# Patient Record
Sex: Male | Born: 1990
Health system: Southern US, Community
[De-identification: ages and names within clinical notes are randomized; demographics above are authoritative.]

---
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
## 2012-04-16 ENCOUNTER — Encounter (HOSPITAL_COMMUNITY): Payer: Self-pay | Admitting: Emergency Medicine

## 2012-04-16 ENCOUNTER — Emergency Department (HOSPITAL_COMMUNITY)
Admission: EM | Admit: 2012-04-16 | Discharge: 2012-04-17 | Disposition: A | Discharge status: Home / Self Care | Payer: Self-pay | Attending: Emergency Medicine | Admitting: Emergency Medicine

## 2012-04-16 DIAGNOSIS — F17200 Nicotine dependence, unspecified, uncomplicated: Secondary | ICD-10-CM | POA: Insufficient documentation

## 2012-04-16 DIAGNOSIS — J40 Bronchitis, not specified as acute or chronic: Secondary | ICD-10-CM | POA: Insufficient documentation

## 2012-04-16 NOTE — ED Notes (Signed)
Pt alert, arrives from home, c/o cough URI s/s, onset was several weeks ago, resp even unlabored, skin pwd

## 2012-04-17 ENCOUNTER — Emergency Department (HOSPITAL_COMMUNITY): Payer: Self-pay

## 2012-04-17 MED ORDER — ALBUTEROL SULFATE HFA 108 (90 BASE) MCG/ACT IN AERS
2.0000 | INHALATION_SPRAY | RESPIRATORY_TRACT | Status: DC | PRN
  Administered 2012-04-17: 2 via RESPIRATORY_TRACT
  Filled 2012-04-17: qty 6.7

## 2012-04-17 MED ORDER — AZITHROMYCIN 250 MG PO TABS: 250.0000 mg | ORAL_TABLET | Freq: Every day | ORAL | Status: AC

## 2012-04-17 MED ORDER — HYDROCOD POLST-CHLORPHEN POLST 10-8 MG/5ML PO LQCR: ORAL | Status: AC

## 2012-04-17 NOTE — ED Notes (Signed)
[facility]--- was given Doxycycline 100mg  and completed the 7-day course but symptoms do not improve; pt reports that antibiotic did not do anything.

## 2012-04-17 NOTE — ED Provider Notes (Signed)
History     CSN: [id number]  Arrival date & time 04/16/12  2247   First MD Initiated Contact with Patient 04/17/12 0203      Chief Complaint  Patient presents with  . Cough    (Consider location/radiation/quality/duration/timing/severity/associated sxs/prior treatment) HPI This is a 21 year old white male with history take 3 month history of cough. The cough has become more productive the past 3 days. He states his sputum is of varying colors. He is having some nasal congestion and throat discomfort but denies earache, fever or diarrhea. He has had some posttussive emesis. His symptoms are described as moderate. He was recently treated with doxycycline without improvement. He has not been wheezing.  History reviewed. No pertinent past medical history.  History reviewed. No pertinent past surgical history.  No family history on file.  History  Substance Use Topics  . Smoking status: Current Everyday Smoker -- 1.0 packs/day    Types: Cigarettes  . Smokeless tobacco: Not on file  . Alcohol Use: No      Review of Systems  All other systems reviewed and are negative.    Allergies  Cefzil  Home Medications  No current outpatient prescriptions on file.  BP 127/70  Pulse 73  Temp 97.9 F (36.6 C) (Oral)  Resp 15  SpO2 100%  Physical Exam General: Well-developed, well-nourished male in no acute distress; appearance consistent with age of record HENT: normocephalic, atraumatic; nasal congestion; no pharyngeal erythema or exudate Eyes: pupils equal round and reactive to light; extraocular muscles intact Neck: supple Heart: regular rate and rhythm Lungs: clear to auscultation bilaterally; persistent dry cough Abdomen: soft; nondistended Extremities: No deformity; full range of motion Neurologic: Awake, alert and oriented; motor function intact in all extremities and symmetric; no facial droop Skin: Warm and dry Psychiatric: Normal mood and affect    ED Course    Procedures (including critical care time)     MDM  Nursing notes and vitals signs, including pulse oximetry, reviewed.  Summary of this visit's results, reviewed by myself:  Imaging Studies: Dg Chest 2 View  04/17/2012  *RADIOLOGY REPORT*  Clinical Data: Cough, chest pain  CHEST - 2 VIEW  Comparison: None.  Findings: Lungs are clear.  No pleural effusion or pneumothorax.  Cardiomediastinal silhouette is within normal limits.  Visualized osseous structures are within normal limits.  IMPRESSION: Normal chest radiographs.  Original Report Authenticated By: Charline Bills, M.D.            Hanley Seamen, MD 04/17/12 [phone]

## 2018-06-10 ENCOUNTER — Encounter (HOSPITAL_COMMUNITY): Payer: Self-pay | Admitting: Emergency Medicine

## 2018-06-10 ENCOUNTER — Emergency Department (HOSPITAL_COMMUNITY): Payer: No Typology Code available for payment source

## 2018-06-10 ENCOUNTER — Other Ambulatory Visit: Payer: Self-pay

## 2018-06-10 ENCOUNTER — Emergency Department (HOSPITAL_COMMUNITY)
Admission: EM | Admit: 2018-06-10 | Discharge: 2018-06-11 | Disposition: A | Discharge status: Home / Self Care | Payer: No Typology Code available for payment source | Attending: Emergency Medicine | Admitting: Emergency Medicine

## 2018-06-10 DIAGNOSIS — W260XXA Contact with knife, initial encounter: Secondary | ICD-10-CM | POA: Insufficient documentation

## 2018-06-10 DIAGNOSIS — Y990 Civilian activity done for income or pay: Secondary | ICD-10-CM | POA: Insufficient documentation

## 2018-06-10 DIAGNOSIS — S61215A Laceration without foreign body of left ring finger without damage to nail, initial encounter: Secondary | ICD-10-CM | POA: Insufficient documentation

## 2018-06-10 DIAGNOSIS — F17210 Nicotine dependence, cigarettes, uncomplicated: Secondary | ICD-10-CM | POA: Insufficient documentation

## 2018-06-10 DIAGNOSIS — S6992XA Unspecified injury of left wrist, hand and finger(s), initial encounter: Secondary | ICD-10-CM | POA: Diagnosis present

## 2018-06-10 DIAGNOSIS — Z79899 Other long term (current) drug therapy: Secondary | ICD-10-CM | POA: Diagnosis not present

## 2018-06-10 DIAGNOSIS — Y929 Unspecified place or not applicable: Secondary | ICD-10-CM | POA: Diagnosis not present

## 2018-06-10 DIAGNOSIS — S61311A Laceration without foreign body of left index finger with damage to nail, initial encounter: Secondary | ICD-10-CM

## 2018-06-10 DIAGNOSIS — Y939 Activity, unspecified: Secondary | ICD-10-CM | POA: Insufficient documentation

## 2018-06-10 MED ORDER — TETANUS-DIPHTH-ACELL PERTUSSIS 5-2.5-18.5 LF-MCG/0.5 IM SUSP
0.5000 mL | Freq: Once | INTRAMUSCULAR | Status: AC
Start: 2018-06-10 — End: 2018-06-10
  Administered 2018-06-10: 0.5 mL via INTRAMUSCULAR
  Filled 2018-06-10: qty 0.5

## 2018-06-10 MED ORDER — LIDOCAINE-EPINEPHRINE (PF) 2 %-1:200000 IJ SOLN
10.0000 mL | Freq: Once | INTRAMUSCULAR | Status: AC
  Administered 2018-06-10: 10 mL
  Filled 2018-06-10: qty 20

## 2018-06-10 NOTE — ED Provider Notes (Signed)
[facility] EMERGENCY DEPARTMENT Provider Note   CSN: [id number] Arrival date & time: 06/10/18  2155     History   Chief Complaint Chief Complaint  Patient presents with  . Laceration    HPI ANUSH WIEDEMAN is a 27 y.o. male.  27 year old male presents with laceration to the left index finger.  Patient states that he was using a knife at work to cut open a package of fish when he accidentally cut the tip of his left finger.  Last tetanus is unknown, bleeding is controlled prior to arrival.  No other complaints, injuries, concerns.     History reviewed. No pertinent past medical history.  There are no active problems to display for this patient.   History reviewed. No pertinent surgical history.      Home Medications    Prior to Admission medications   Medication Sig Start Date End Date Taking? Authorizing Provider  chlorpheniramine-HYDROcodone Stevphen Meuse PENNKINETIC ER) 10-8 MG/5ML LQCR Take 5 mL every 12 hours as needed for cough 04/17/12   Molpus, John, MD    Family History No family history on file.  Social History Social History   Tobacco Use  . Smoking status: Current Every Day Smoker    Packs/day: 1.00    Types: Cigarettes  . Smokeless tobacco: Never Used  Substance Use Topics  . Alcohol use: No  . Drug use: Never     Allergies   Cefzil [cefprozil]   Review of Systems Review of Systems  Constitutional: Negative for fever.  Musculoskeletal: Negative for arthralgias and myalgias.  Skin: Positive for wound.  Allergic/Immunologic: Negative for immunocompromised state.  Neurological: Negative for weakness and numbness.  Hematological: Does not bruise/bleed easily.  All other systems reviewed and are negative.    Physical Exam Updated Vital Signs BP 129/83 (BP Location: Left Arm)   Pulse 76   Temp 99.2 F (37.3 C) (Oral)   Resp 16   SpO2 100%   Physical Exam  Constitutional: He is oriented to person, place, and  time. He appears well-developed and well-nourished. No distress.  HENT:  Head: Normocephalic and atraumatic.  Cardiovascular: Intact distal pulses.  Pulmonary/Chest: Effort normal.  Musculoskeletal: He exhibits no tenderness or deformity.       Hands: Neurological: He is alert and oriented to person, place, and time. No sensory deficit.  Skin: Skin is warm and dry. He is not diaphoretic.  Psychiatric: He has a normal mood and affect. His behavior is normal.  Nursing note and vitals reviewed.    ED Treatments / Results  Labs (all labs ordered are listed, but only abnormal results are displayed) Labs Reviewed - No data to display  EKG None  Radiology Dg Hand Complete Right  Result Date: 06/10/2018 CLINICAL DATA:  Right index finger laceration EXAM: RIGHT HAND - COMPLETE 3+ VIEW COMPARISON:  None. FINDINGS: Soft tissue avulsion of the distal right index finger. No osseous injury. No retained foreign body. IMPRESSION: Avulsion type laceration of the distal soft tissues of the right index finger. No acute osseous injury or radiopaque foreign body. Electronically Signed   By: Deatra Robinson M.D.   On: 06/10/2018 23:07    Procedures .Marland KitchenLaceration Repair Date/Time: 06/11/2018 12:07 AM Performed by: Jeannie Fend, PA-C Authorized by: Jeannie Fend, PA-C   Consent:    Consent obtained:  Verbal   Consent given by:  Patient   Risks discussed:  Infection, need for additional repair, pain, poor cosmetic result and poor wound healing  Alternatives discussed:  No treatment Anesthesia (see MAR for exact dosages):    Anesthesia method:  Topical application   Topical anesthesia: lidocaine with epi soak. Laceration details:    Location:  Finger   Finger location:  L index finger   Length (cm):  0.7   Depth (mm):  3 Repair type:    Repair type:  Simple Pre-procedure details:    Preparation:  Patient was prepped and draped in usual sterile fashion and imaging obtained to evaluate for  foreign bodies Exploration:    Hemostasis achieved with:  Epinephrine   Wound exploration: wound explored through full range of motion and entire depth of wound probed and visualized     Wound extent: no foreign bodies/material noted and no muscle damage noted     Contaminated: no   Treatment:    Area cleansed with:  Saline   Amount of cleaning:  Standard   Irrigation solution:  Sterile saline Skin repair:    Repair method:  Tissue adhesive Approximation:    Approximation:  Close Post-procedure details:    Dressing:  Bulky dressing   Patient tolerance of procedure:  Tolerated well, no immediate complications   (including critical care time)  Medications Ordered in ED Medications  lidocaine-EPINEPHrine (XYLOCAINE W/EPI) 2 %-1:200000 (PF) injection 10 mL (10 mLs Other Given 06/10/18 2344)  Tdap (BOOSTRIX) injection 0.5 mL (0.5 mLs Intramuscular Given 06/10/18 2344)     Initial Impression / Assessment and Plan / ED Course  I have reviewed the triage vital signs and the nursing notes.  Pertinent labs & imaging results that were available during my care of the patient were reviewed by me and considered in my medical decision making (see chart for details).  Clinical Course as of Jun 11 8  Wed Jun 11, 2018  [id number] 27 year old male presents with near complete avulsion of the distal tip of his left index finger.  Patient is right-hand dominant.  Injury occurred at work while cutting open a package of fish with a knife.  Bleeding was controlled upon arrival.  Gave patient the option of attempting sutures to hold on the distal tip versus Dermabond.  Patient prefers Dermabond. Patient tolerated procedure well, recommend wound recheck in 2 days.   [LM]    Clinical Course User Index [LM] Jeannie FendMurphy, Laura A, PA-C     Final Clinical Impressions(s) / ED Diagnoses   Final diagnoses:  Laceration of left index finger without foreign body with damage to nail, initial encounter    ED Discharge  Orders    None       Alden HippMurphy, Laura A, PA-C 06/11/18 0009    Raeford RazorKohut, Stephen, MD 06/19/18 1327

## 2018-06-10 NOTE — ED Triage Notes (Signed)
Pt cut right index finger with a cooking knife. Bleeding controlled at this time. Unknown last tetanus.

## 2018-06-11 NOTE — Discharge Instructions (Addendum)
Keep finger clean and dry.  Wound recheck in 2 days with Worker's Comp. provider. Do not apply any antibiotic ointment to the wound.  Keep fingernails trimmed short.

## 2020-02-29 ENCOUNTER — Other Ambulatory Visit: Payer: Self-pay

## 2020-02-29 ENCOUNTER — Encounter (HOSPITAL_COMMUNITY): Payer: Self-pay | Admitting: Psychiatry

## 2020-02-29 ENCOUNTER — Ambulatory Visit (INDEPENDENT_AMBULATORY_CARE_PROVIDER_SITE_OTHER): Payer: 59 | Admitting: Psychiatry

## 2020-02-29 DIAGNOSIS — F3181 Bipolar II disorder: Secondary | ICD-10-CM

## 2020-02-29 MED ORDER — LAMOTRIGINE 200 MG PO TABS: 200.0000 mg | ORAL_TABLET | Freq: Two times a day (BID) | ORAL | 2 refills | Status: DC

## 2020-02-29 NOTE — Progress Notes (Signed)
Psychiatric Initial Adult Assessment   Patient Identification: Mitchell Sanchez MRN:  [id number] Date of Evaluation:  02/29/2020 Referral Source: Vesta Mixer Chief Complaint:  " Feel like I am doing good on current medication". Visit Diagnosis:    ICD-10-CM   1. Bipolar 2 disorder (HCC)  F31.81 lamoTRIgine (LAMICTAL) 200 MG tablet    History of Present Illness: 29 year old male seen today for initial psychiatric evaluation.  Patient was referred to outpatient psychiatry by [facility] for medication management.  Psychiatric history of bipolar 2 disorder and is currently prescribed Lamictal 200 mg twice daily.  He reports that his current medication regiment is effective in managing his mood.  He notes that at times he feels depressed, has difficulty sleeping, has difficulty concentrating, as racing thoughts, and elevated mood.  He notes that he is managing well and would not like medications adjusted at this time.Today he denies SI/HI/VH.    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  He notes that at times work is stressful however reports that he manages his stress well.  He notes that his mother and friends are supportive and he plans to follow-up with an outpatient counselor in a couple weeks.  No other concerns noted at this time.    Associated Signs/Symptoms: Depression Symptoms:  depressed mood, insomnia, fatigue, difficulty concentrating, (Hypo) Manic Symptoms:  Elevated Mood, Flight of Ideas, Anxiety Symptoms:  Denies Psychotic Symptoms:  Denies PTSD Symptoms: Denies  Past Psychiatric History: Bipolar 2  Previous Psychotropic Medications: Buspar and wellbutrin  Substance Abuse History in the last 12 months:  No.  Consequences of Substance Abuse: Denies  Past Medical History: History reviewed. No pertinent past medical history. History reviewed. No pertinent surgical history.  Family Psychiatric History: Patient reports that his mother has psychiatric conditions  however un aware of which condition  Family History: History reviewed. No pertinent family history.  Social History:   Social History   Socioeconomic History   Marital status: Single    Spouse name: Not on file   Number of children: Not on file   Years of education: Not on file   Highest education level: Not on file  Occupational History   Not on file  Tobacco Use   Smoking status: Current Every Day Smoker    Packs/day: 1.00    Types: Cigarettes   Smokeless tobacco: Never Used  Substance and Sexual Activity   Alcohol use: No   Drug use: Never   Sexual activity: Not on file  Other Topics Concern   Not on file  Social History Narrative   Not on file   Social Determinants of Health   Financial Resource Strain:    Difficulty of Paying Living Expenses:   Food Insecurity:    Worried About Programme researcher, broadcasting/film/video in the Last Year:    Barista in the Last Year:   Transportation Needs:    Freight forwarder (Medical):    Lack of Transportation (Non-Medical):   Physical Activity:    Days of Exercise per Week:    Minutes of Exercise per Session:   Stress:    Feeling of Stress :   Social Connections:    Frequency of Communication with Friends and Family:    Frequency of Social Gatherings with Friends and Family:    Attends Religious Services:    Active Member of Clubs or Organizations:    Attends Banker Meetings:    Marital Status:  Additional Social History: He is single and has no children.  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  He denies illicit drug use.  He reports that he occasionally drinks alcohol smokes electronic cigarette.  Allergies:   Allergies  Allergen Reactions   Cefzil [Cefprozil]     Metabolic Disorder Labs: No results found for: HGBA1C, MPG No results found for: PROLACTIN No results found for: CHOL, TRIG, HDL, CHOLHDL, VLDL, LDLCALC No results found for: TSH  Therapeutic Level  Labs: No results found for: LITHIUM No results found for: CBMZ No results found for: VALPROATE  Current Medications: Current Outpatient Medications  Medication Sig Dispense Refill   lamoTRIgine (LAMICTAL) 200 MG tablet Take 1 tablet (200 mg total) by mouth in the morning and at bedtime. 60 tablet 2   chlorpheniramine-HYDROcodone (TUSSIONEX PENNKINETIC ER) 10-8 MG/5ML LQCR Take 5 mL every 12 hours as needed for cough 115 mL 0   No current facility-administered medications for this visit.    Musculoskeletal: Strength & Muscle Tone: within normal limits Gait & Station: normal Patient leans: N/A  Psychiatric Specialty Exam: Review of Systems  There were no vitals taken for this visit.There is no height or weight on file to calculate BMI.  General Appearance: Fairly Groomed  Eye Contact:  Good  Speech:  Clear and Coherent and Normal Rate  Volume:  Normal  Mood:  Euthymic  Affect:  Congruent  Thought Process:  Coherent, Goal Directed and Linear  Orientation:  Full (Time, Place, and Person)  Thought Content:  WDL and Logical  Suicidal Thoughts:  No  Homicidal Thoughts:  No  Memory:  Immediate;   Good Recent;   Good Remote;   Good  Judgement:  Good  Insight:  Good  Psychomotor Activity:  Normal  Concentration:  Concentration: Good and Attention Span: Good  Recall:  Good  Fund of Knowledge:Good  Language: Good  Akathisia:  No  Handed:  Right  AIMS (if indicated):  Not done   Assets:  Communication Skills Desire for Improvement Housing Social Support  ADL's:  Intact  Cognition: WNL  Sleep:  Good   Screenings:   Assessment and Plan: Patient reports that he is doing well on current dose of Lamictal.  No medication adjustment requested at this time.  Patient is agreeable to take medications prescribed.  1. Bipolar 2 disorder (HCC)  Continue - lamoTRIgine (LAMICTAL) 200 MG tablet; Take 1 tablet (200 mg total) by mouth in the morning and at bedtime.  Dispense: 60  tablet; Refill: 2  Follow-up in 2 months      Salley Slaughter, NP 6/7/20215:35 PM

## 2020-03-03 ENCOUNTER — Ambulatory Visit (HOSPITAL_COMMUNITY): Payer: 59 | Admitting: Licensed Clinical Social Worker

## 2020-03-03 ENCOUNTER — Other Ambulatory Visit: Payer: Self-pay

## 2020-04-26 ENCOUNTER — Encounter (HOSPITAL_COMMUNITY): Payer: Self-pay | Admitting: Psychiatry

## 2020-04-26 ENCOUNTER — Ambulatory Visit (INDEPENDENT_AMBULATORY_CARE_PROVIDER_SITE_OTHER): Payer: 59 | Admitting: Psychiatry

## 2020-04-26 ENCOUNTER — Other Ambulatory Visit: Payer: Self-pay

## 2020-04-26 DIAGNOSIS — F3181 Bipolar II disorder: Secondary | ICD-10-CM | POA: Diagnosis not present

## 2020-04-26 DIAGNOSIS — F411 Generalized anxiety disorder: Secondary | ICD-10-CM | POA: Diagnosis not present

## 2020-04-26 MED ORDER — HYDROXYZINE HCL 10 MG PO TABS: 10.0000 mg | ORAL_TABLET | Freq: Three times a day (TID) | ORAL | 2 refills | Status: DC | PRN

## 2020-04-26 MED ORDER — LAMOTRIGINE 200 MG PO TABS: 200.0000 mg | ORAL_TABLET | Freq: Two times a day (BID) | ORAL | 2 refills | Status: DC

## 2020-04-26 NOTE — Progress Notes (Signed)
[facility] MD/PA/NP OP Progress Note  04/26/2020 1:51 PM Mitchell Sanchez  MRN:  [id number]  Chief Complaint: "I'm feeling tired today."  HPI: 29 year old male seen today for follow-up evaluation and medication management.  Patient has a history of bipolar 2 disorder.  He is currently prescribed Lamictal 200 mg twice daily.  Patient reports that his depressive symptoms fluctuate at times, but his Lamictal medication is effective in managing his depressive symptoms.  Today, patient reports that he feels tired and believes that it is mainly due to working.  He however endorses increased anxiety symptoms and occasional restlessness without a known trigger.  Patient reports that he experiences increased anxiety both at work and in other settings.  He reports that he usually tries to control his anxiety by trying not to fixate on things.  Provider encouraged patient to try stress reducing techniques such as grounding and deep breathing exercises to decrease stress.  Patient is agreeable to try using these techniques in the future.  Provider offered to start Hydroxyzine 10 mg as needed three times a day and/or Buspar 10 mg three times a day to decrease anxiety symptoms.  Patient is agreeable to start Hydroxyzine medication as needed at this time.   Patient denies SI/HI/AVH. No other concerns at this time.   Visit Diagnosis:    ICD-10-CM   1. Generalized anxiety disorder  F41.1 hydrOXYzine (ATARAX/VISTARIL) 10 MG tablet  2. Bipolar 2 disorder (HCC)  F31.81 lamoTRIgine (LAMICTAL) 200 MG tablet    Past Psychiatric History: Bipolar 2  Past Medical History: History reviewed. No pertinent past medical history. History reviewed. No pertinent surgical history.  Family Psychiatric History: Patient reports that his mother has psychiatric conditions however he is unaware of which conditions  Family History: History reviewed. No pertinent family history.  Social History:  Social History   Socioeconomic History  .  Marital status: Single    Spouse name: Not on file  . Number of children: Not on file  . Years of education: Not on file  . Highest education level: Not on file  Occupational History  . Not on file  Tobacco Use  . Smoking status: Current Every Day Smoker    Packs/day: 1.00    Types: Cigarettes  . Smokeless tobacco: Never Used  Substance and Sexual Activity  . Alcohol use: No  . Drug use: Never  . Sexual activity: Not on file  Other Topics Concern  . Not on file  Social History Narrative  . Not on file   Social Determinants of Health   Financial Resource Strain:   . Difficulty of Paying Living Expenses:   Food Insecurity:   . Worried About Programme researcher, broadcasting/film/video in the Last Year:   . Barista in the Last Year:   Transportation Needs:   . Freight forwarder (Medical):   Marland Kitchen Lack of Transportation (Non-Medical):   Physical Activity:   . Days of Exercise per Week:   . Minutes of Exercise per Session:   Stress:   . Feeling of Stress :   Social Connections:   . Frequency of Communication with Friends and Family:   . Frequency of Social Gatherings with Friends and Family:   . Attends Religious Services:   . Active Member of Clubs or Organizations:   . Attends Banker Meetings:   Marland Kitchen Marital Status:     Allergies:  Allergies  Allergen Reactions  . Cefzil [Cefprozil]     Metabolic Disorder Labs: No  results found for: HGBA1C, MPG No results found for: PROLACTIN No results found for: CHOL, TRIG, HDL, CHOLHDL, VLDL, LDLCALC No results found for: TSH  Therapeutic Level Labs: No results found for: LITHIUM No results found for: VALPROATE No components found for:  CBMZ  Current Medications: Current Outpatient Medications  Medication Sig Dispense Refill  . chlorpheniramine-HYDROcodone (TUSSIONEX PENNKINETIC ER) 10-8 MG/5ML LQCR Take 5 mL every 12 hours as needed for cough 115 mL 0  . hydrOXYzine (ATARAX/VISTARIL) 10 MG tablet Take 1 tablet (10 mg  total) by mouth 3 (three) times daily as needed. 90 tablet 2  . lamoTRIgine (LAMICTAL) 200 MG tablet Take 1 tablet (200 mg total) by mouth in the morning and at bedtime. 60 tablet 2   No current facility-administered medications for this visit.     Musculoskeletal: Strength & Muscle Tone: within normal limits Gait & Station: normal Patient leans: N/A  Psychiatric Specialty Exam: Review of Systems  There were no vitals taken for this visit.There is no height or weight on file to calculate BMI.  General Appearance: Well Groomed  Eye Contact:  Good  Speech:  Clear and Coherent and Normal Rate  Volume:  Normal  Mood:  Anxious  Affect:  Congruent  Thought Process:  Coherent and Linear  Orientation:  Full (Time, Place, and Person)  Thought Content: Logical   Suicidal Thoughts:  No  Homicidal Thoughts:  No  Memory:  Immediate;   Good Recent;   Good Remote;   Good  Judgement:  Good  Insight:  Good  Psychomotor Activity:  Normal  Concentration:  Concentration: Good and Attention Span: Good  Recall:  Good  Fund of Knowledge: Good  Language: Good  Akathisia:  No  Handed:  Right  AIMS (if indicated): Not done  Assets:  Communication Skills Desire for Improvement Financial Resources/Insurance  ADL's:  Intact  Cognition: WNL  Sleep:  Fair    Assessment and Plan: Patient reports a recent increase of anxiety and occasional restlessness.  Patient is agreeable to start Hydroxyzine 10 mg as needed three times a day to reduce his anxiety symptoms.  Patient is agreeable to continue his Lamictal as prescribed.    1. Bipolar 2 disorder (HCC)  -Continue lamoTRIgine (LAMICTAL) 200 MG tablet; Take 1 tablet (200 mg total) by mouth in the morning and at bedtime.  Dispense: 60 tablet; Refill: 2  2. Generalized anxiety disorder  -Start hydrOXYzine (ATARAX/VISTARIL) 10 MG tablet; Take 1 tablet (10 mg total) by mouth 3 (three) times daily as needed.  Dispense: 90 tablet; Refill: 2  Follow-up  in 2 months.   Shanna Cisco, NP 04/26/2020, 1:51 PM

## 2020-05-01 IMAGING — DX DG HAND COMPLETE 3+V*R*
3 series · 3 of 3 positions shown · non-contrast
Comparison: None.

CLINICAL DATA: Right index finger laceration

EXAM:
RIGHT HAND - COMPLETE 3+ VIEW

[hand pa]
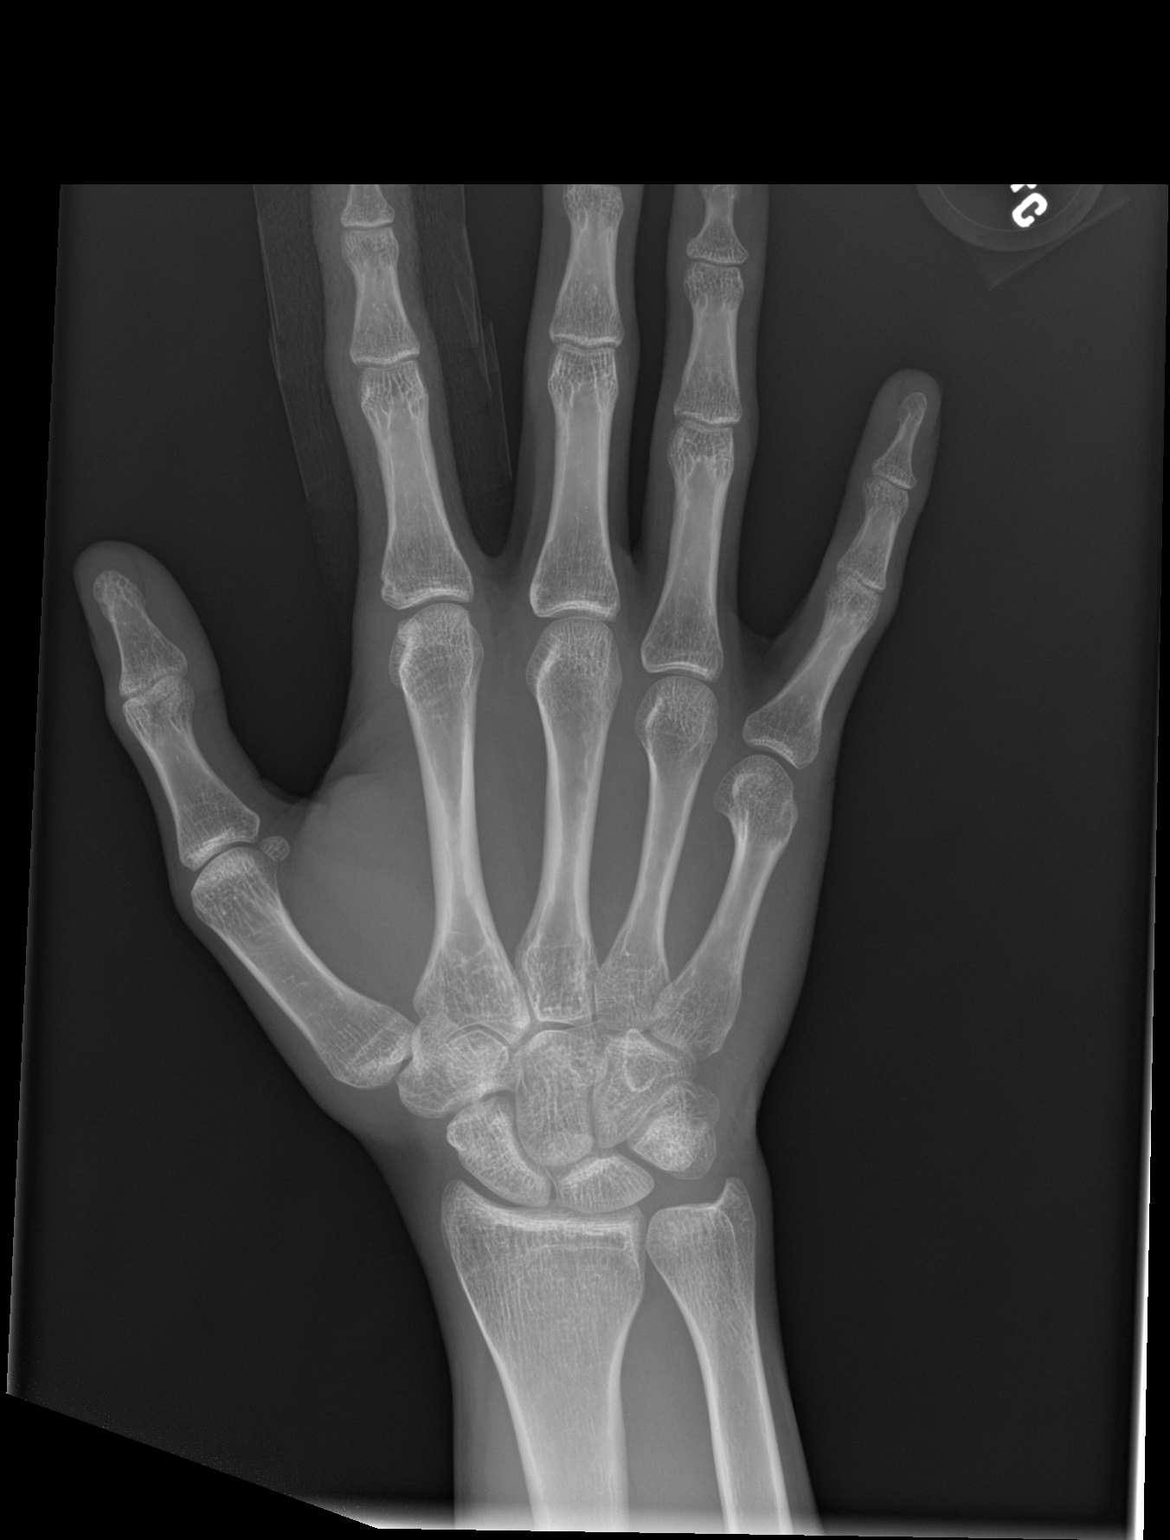

[hand obl]
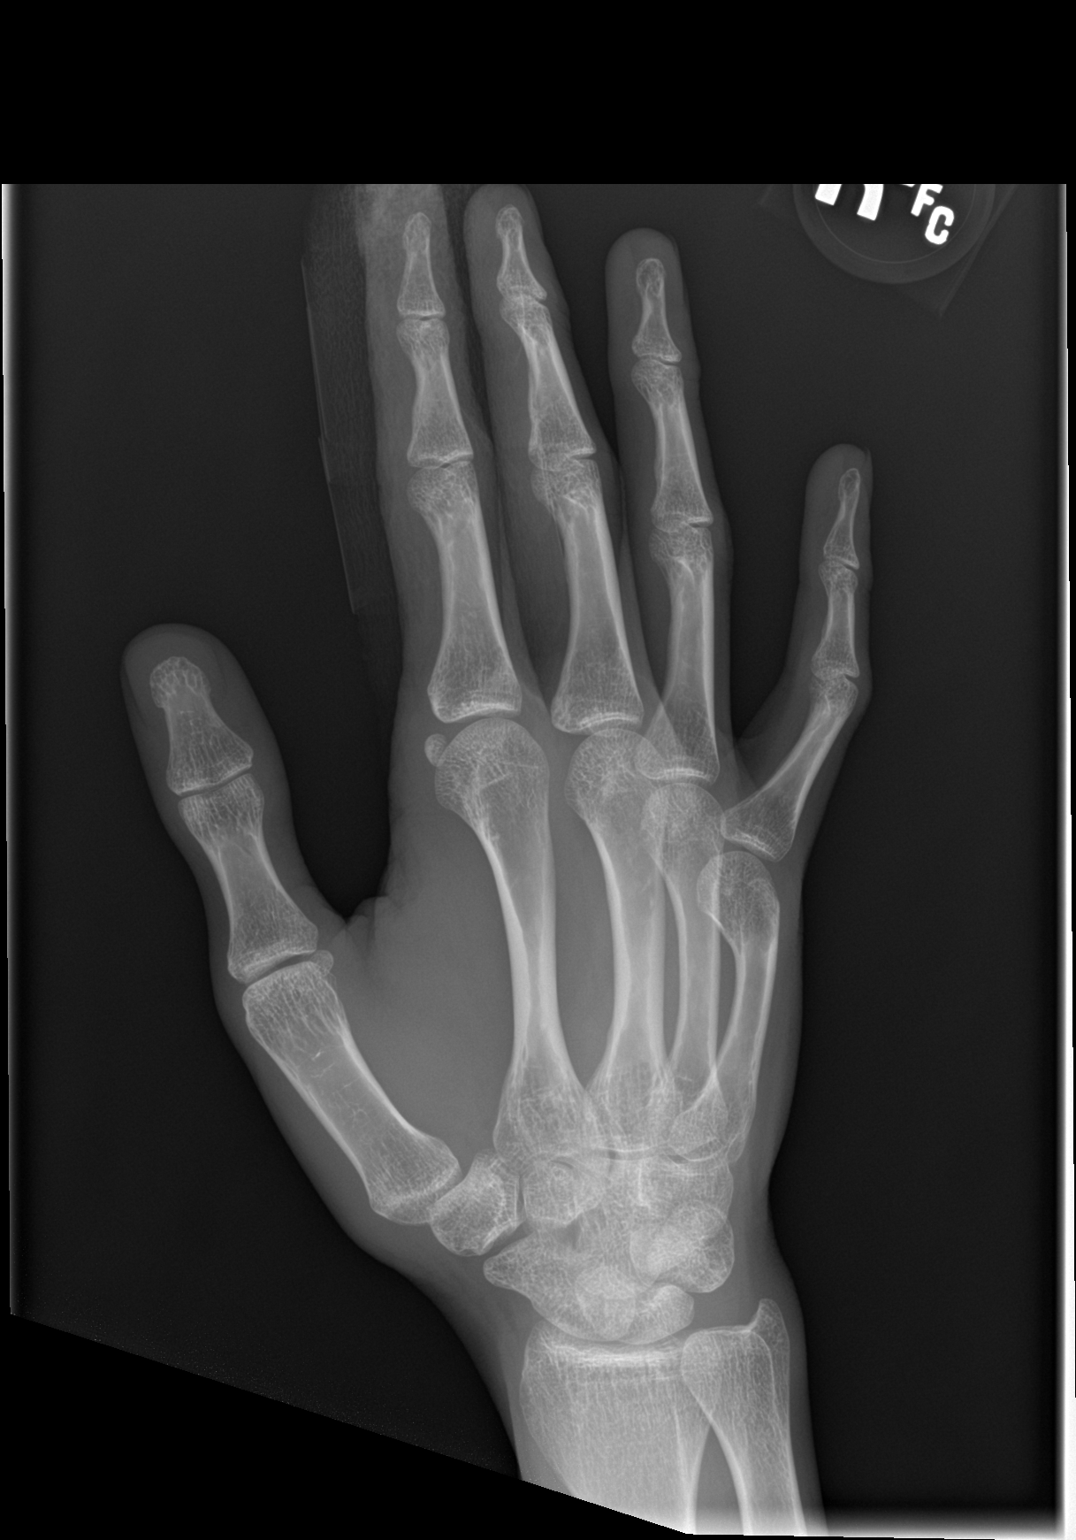

[hand lat]
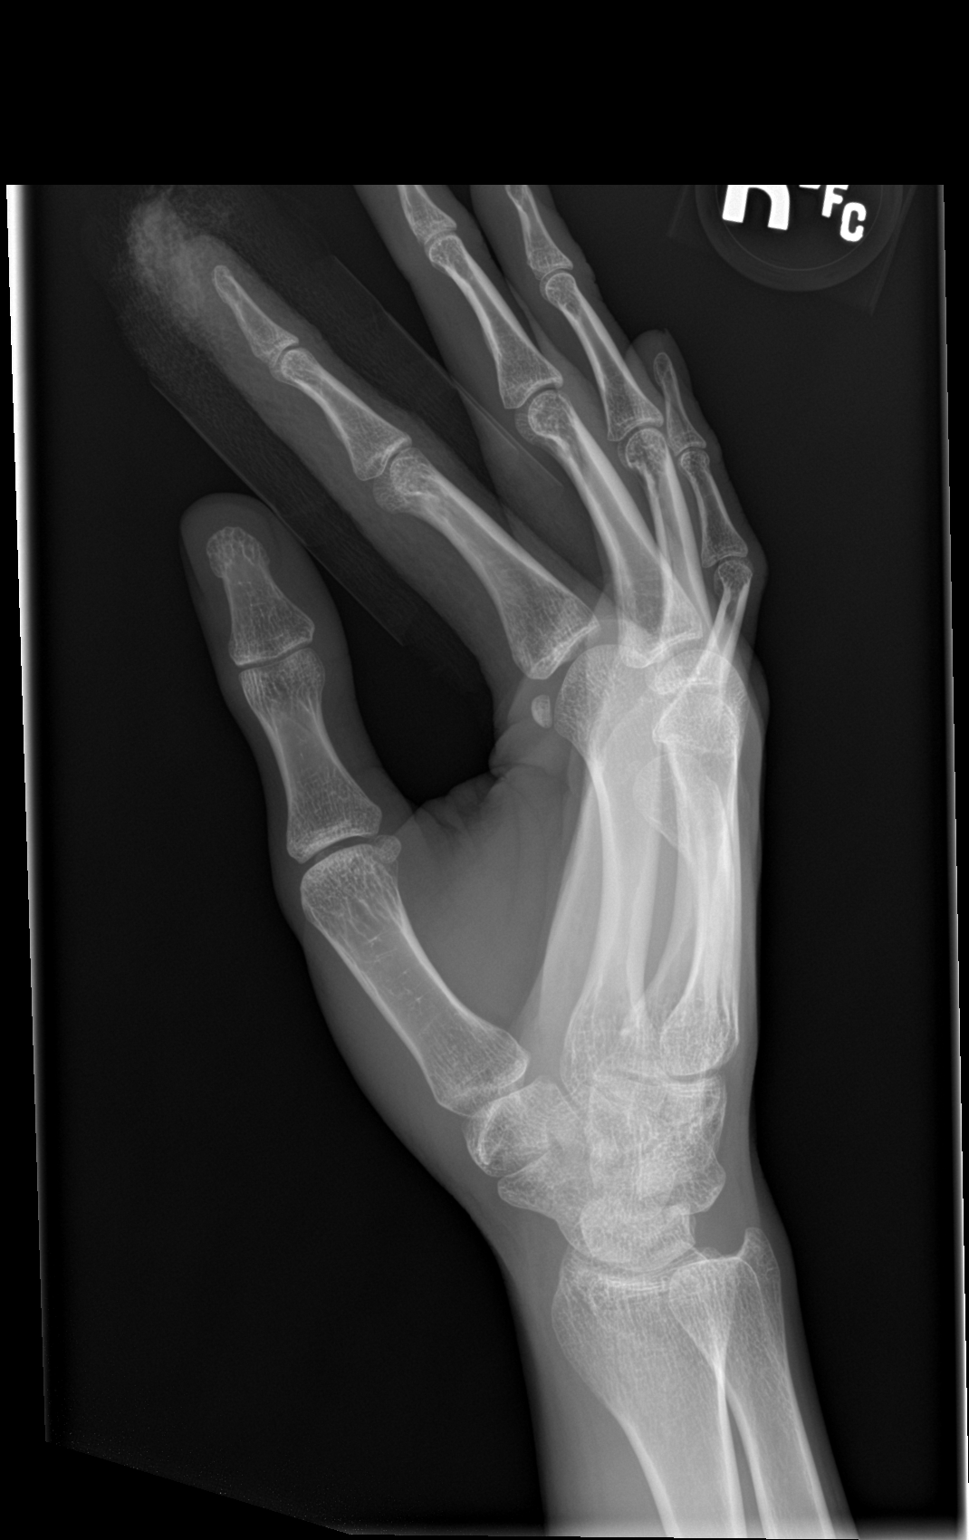

[3 of 3 positions shown; findings below may reference images not displayed]

FINDINGS: Soft tissue avulsion of the distal right index finger. No osseous
injury. No retained foreign body.
IMPRESSION: Avulsion type laceration of the distal soft tissues of the right
index finger. No acute osseous injury or radiopaque foreign body.

## 2020-06-21 ENCOUNTER — Encounter (HOSPITAL_COMMUNITY): Payer: 59 | Admitting: Psychiatry

## 2020-09-28 ENCOUNTER — Other Ambulatory Visit: Payer: Self-pay

## 2020-09-28 ENCOUNTER — Telehealth (INDEPENDENT_AMBULATORY_CARE_PROVIDER_SITE_OTHER): Payer: BC Managed Care – PPO | Admitting: Psychiatry

## 2020-09-28 ENCOUNTER — Encounter (HOSPITAL_COMMUNITY): Payer: Self-pay | Admitting: Psychiatry

## 2020-09-28 DIAGNOSIS — F3181 Bipolar II disorder: Secondary | ICD-10-CM | POA: Diagnosis not present

## 2020-09-28 DIAGNOSIS — F411 Generalized anxiety disorder: Secondary | ICD-10-CM | POA: Diagnosis not present

## 2020-09-28 MED ORDER — HYDROXYZINE HCL 10 MG PO TABS: 10.0000 mg | ORAL_TABLET | Freq: Three times a day (TID) | ORAL | 2 refills | Status: DC | PRN

## 2020-09-28 MED ORDER — LAMOTRIGINE 200 MG PO TABS: 200.0000 mg | ORAL_TABLET | Freq: Two times a day (BID) | ORAL | 2 refills | Status: DC

## 2020-09-28 NOTE — Progress Notes (Signed)
BH MD/PA/NP OP Progress Note Virtual Visit via Video Note  I connected with Mitchell Sanchez on 09/28/20 at  2:00 PM EST by a video enabled telemedicine application and verified that I am speaking with the correct person using two identifiers.  Location: Patient: Home Provider: Clinic   I discussed the limitations of evaluation and management by telemedicine and the availability of in person appointments. The patient expressed understanding and agreed to proceed.  I provided 30 minutes of non-face-to-face time during this encounter.    09/28/2020 2:22 PM Mitchell Sanchez  MRN:  [id number]  Chief Complaint: "Thing are good."  HPI: 30 year old male seen today for follow-up evaluation and medication management.  Patient has a history of bipolar 2 disorder.  He is currently prescribed Lamictal 200 mg twice daily and hydroxyzine 10 mg three times daily as needed.  Patient reports that his medications are effective in managing his psychiatric conditions.  Today patient is well groomed, pleasant, cooperative, and engaged in conversation. He notes that his mood is stable and reports that he has minimal anxiety and depression. Today provider conducted a GAD7 and patient scored a 9. Provider also conducted a PHQ 9 and patient scored a 4. He endorses adequate sleep and appetite. He denies SI/HI/VAH, mania, or paranoia.  Patient informed provider that he is training at [employer] and notes that he will complete his training in one week. 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    No medication changes made today. Patient agreeable to continue all medications as prescribed. No other concerns at this time.   Visit Diagnosis:    ICD-10-CM   1. Bipolar 2 disorder (HCC)  F31.81 lamoTRIgine (LAMICTAL) 200 MG tablet  2. Generalized anxiety disorder  F41.1 hydrOXYzine (ATARAX/VISTARIL) 10 MG tablet    Past Psychiatric History: Bipolar 2  Past Medical  History: History reviewed. No pertinent past medical history. History reviewed. No pertinent surgical history.  Family Psychiatric History: Patient reports that his mother has psychiatric conditions however he is unaware of which conditions  Family History: History reviewed. No pertinent family history.  Social History:  Social History   Socioeconomic History  . Marital status: Single    Spouse name: Not on file  . Number of children: Not on file  . Years of education: Not on file  . Highest education level: Not on file  Occupational History  . Not on file  Tobacco Use  . Smoking status: Current Every Day Smoker    Packs/day: 1.00    Types: Cigarettes  . Smokeless tobacco: Never Used  Substance and Sexual Activity  . Alcohol use: No  . Drug use: Never  . Sexual activity: Not on file  Other Topics Concern  . Not on file  Social History Narrative  . Not on file   Social Determinants of Health   Financial Resource Strain: Not on file  Food Insecurity: Not on file  Transportation Needs: Not on file  Physical Activity: Not on file  Stress: Not on file  Social Connections: Not on file    Allergies:  Allergies  Allergen Reactions  . Cefzil [Cefprozil]     Metabolic Disorder Labs: No results found for: HGBA1C, MPG No results found for: PROLACTIN No results found for: CHOL, TRIG, HDL, CHOLHDL, VLDL, LDLCALC No results found for: TSH  Therapeutic Level Labs: No results found for: LITHIUM No results found for: VALPROATE No components found for:  CBMZ  Current Medications: Current Outpatient Medications  Medication Sig Dispense Refill  . chlorpheniramine-HYDROcodone (TUSSIONEX PENNKINETIC ER) 10-8 MG/5ML LQCR Take 5 mL every 12 hours as needed for cough 115 mL 0  . hydrOXYzine (ATARAX/VISTARIL) 10 MG tablet Take 1 tablet (10 mg total) by mouth 3 (three) times daily as needed. 90 tablet 2  . lamoTRIgine (LAMICTAL) 200 MG tablet Take 1 tablet (200 mg total) by mouth  in the morning and at bedtime. 60 tablet 2   No current facility-administered medications for this visit.     Musculoskeletal: Strength & Muscle Tone: Unable to assess due to telehealth visit Gait & Station: Unable to assess due to telehealth visit Patient leans: N/A  Psychiatric Specialty Exam: Review of Systems  There were no vitals taken for this visit.There is no height or weight on file to calculate BMI.  General Appearance: Well Groomed  Eye Contact:  Good  Speech:  Clear and Coherent and Normal Rate  Volume:  Normal  Mood:  Euthymic and Notes that he is occassional anxious but reports that he is able to cope with his anxiety  Affect:  Congruent  Thought Process:  Coherent and Linear  Orientation:  Full (Time, Place, and Person)  Thought Content: Logical   Suicidal Thoughts:  No  Homicidal Thoughts:  No  Memory:  Immediate;   Good Recent;   Good Remote;   Good  Judgement:  Good  Insight:  Good  Psychomotor Activity:  Normal  Concentration:  Concentration: Good and Attention Span: Good  Recall:  Good  Fund of Knowledge: Good  Language: Good  Akathisia:  No  Handed:  Right  AIMS (if indicated): Not done  Assets:  Communication Skills Desire for Improvement Financial Resources/Insurance  ADL's:  Intact  Cognition: WNL  Sleep:  Good    Assessment and Plan: Patient reports that he has been doing well on his current medication regimen. He notes he occassionally is anxious but is able to cope with it. No medication changes made today. Patient agreeable to continue all medications as prescribed. No other concerns at this time.     1. Bipolar 2 disorder (HCC)  Continue- lamoTRIgine (LAMICTAL) 200 MG tablet; Take 1 tablet (200 mg total) by mouth in the morning and at bedtime.  Dispense: 60 tablet; Refill: 2  2. Generalized anxiety disorder  Continue-hydrOXYzine (ATARAX/VISTARIL) 10 MG tablet; Take 1 tablet (10 mg total) by mouth 3 (three) times daily as needed.   Dispense: 90 tablet; Refill: 2  Follow-up in 44months.   Shanna Cisco, NP 09/28/2020, 2:22 PM

## 2020-10-25 ENCOUNTER — Other Ambulatory Visit (HOSPITAL_COMMUNITY): Payer: Self-pay | Admitting: Psychiatry

## 2020-10-25 DIAGNOSIS — F3181 Bipolar II disorder: Secondary | ICD-10-CM

## 2020-10-25 DIAGNOSIS — F411 Generalized anxiety disorder: Secondary | ICD-10-CM

## 2020-12-27 ENCOUNTER — Telehealth (INDEPENDENT_AMBULATORY_CARE_PROVIDER_SITE_OTHER): Payer: BC Managed Care – PPO | Admitting: Psychiatry

## 2020-12-27 ENCOUNTER — Other Ambulatory Visit: Payer: Self-pay

## 2020-12-27 ENCOUNTER — Encounter (HOSPITAL_COMMUNITY): Payer: Self-pay | Admitting: Psychiatry

## 2020-12-27 DIAGNOSIS — F3181 Bipolar II disorder: Secondary | ICD-10-CM | POA: Diagnosis not present

## 2020-12-27 DIAGNOSIS — F411 Generalized anxiety disorder: Secondary | ICD-10-CM

## 2020-12-27 MED ORDER — LAMOTRIGINE 200 MG PO TABS: 200.0000 mg | ORAL_TABLET | Freq: Two times a day (BID) | ORAL | 2 refills | Status: DC

## 2020-12-27 MED ORDER — HYDROXYZINE HCL 10 MG PO TABS: 10.0000 mg | ORAL_TABLET | Freq: Three times a day (TID) | ORAL | 2 refills | Status: DC | PRN

## 2020-12-27 NOTE — Progress Notes (Signed)
BH MD/PA/NP OP Progress Note Virtual Visit via Video Note  I connected with Mitchell Sanchez on 12/27/20 at  2:00 PM EDT by a video enabled telemedicine application and verified that I am speaking with the correct person using two identifiers.  Location: Patient: work Provider: Clinic   I discussed the limitations of evaluation and management by telemedicine and the availability of in person appointments. The patient expressed understanding and agreed to proceed.  I provided 30 minutes of non-face-to-face time during this encounter.    12/27/2020 2:11 PM Mitchell Sanchez  MRN:  [id number]  Chief Complaint: "Things are going pretty well"  HPI: 30 year old male seen today for follow-up evaluation and medication management.  He has a history of bipolar 2 disorder.  He is currently prescribed Lamictal 200 mg twice daily and hydroxyzine 10 mg three times daily as needed.  Patient reports that his medications are effective in managing his psychiatric conditions.  Today patient is well groomed, pleasant, cooperative, and engaged in conversation. He notes that since his last visit things have been going well. 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 He notes that he does become anxious and depressed at times however notes that its minimal. Today provider conducted a GAD7 and patient scored an 11, at his last visit he scored a 9. Provider also conducted a PHQ 9 and patient scored a 7, at his last visit he scored a 4. He notes that he sleeps 4-6 hours nightly and endorses an adequate appetite. He denies SI/HI/VAH, mania, or paranoia.    No medication changes made today. Patient agreeable to continue all medications as prescribed. No other concerns at this time.   Visit Diagnosis:    ICD-10-CM   1. Generalized anxiety disorder  F41.1  hydrOXYzine (ATARAX/VISTARIL) 10 MG tablet  2. Bipolar 2 disorder (HCC)  F31.81 lamoTRIgine (LAMICTAL) 200 MG tablet    Past Psychiatric History: Bipolar 2  Past Medical History: No past medical history on file. No past surgical history on file.  Family Psychiatric History: Patient reports that his mother has psychiatric conditions however he is unaware of which conditions  Family History: No family history on file.  Social History:  Social History   Socioeconomic History  . Marital status: Single    Spouse name: Not on file  . Number of children: Not on file  . Years of education: Not on file  . Highest education level: Not on file  Occupational History  . Not on file  Tobacco Use  . Smoking status: Current Every Day Smoker    Packs/day: 1.00    Types: Cigarettes  . Smokeless tobacco: Never Used  Substance and Sexual Activity  . Alcohol use: No  . Drug use: Never  . Sexual activity: Not on file  Other Topics Concern  . Not on file  Social History Narrative  . Not on file   Social Determinants of Health   Financial Resource Strain: Not on file  Food Insecurity: Not on file  Transportation Needs: Not on file  Physical Activity: Not on file  Stress: Not on file  Social Connections: Not on file    Allergies:  Allergies  Allergen Reactions  . Cefzil [Cefprozil]     Metabolic Disorder Labs: No results found for: HGBA1C, MPG No results found for: PROLACTIN No  results found for: CHOL, TRIG, HDL, CHOLHDL, VLDL, LDLCALC No results found for: TSH  Therapeutic Level Labs: No results found for: LITHIUM No results found for: VALPROATE No components found for:  CBMZ  Current Medications: Current Outpatient Medications  Medication Sig Dispense Refill  . chlorpheniramine-HYDROcodone (TUSSIONEX PENNKINETIC ER) 10-8 MG/5ML LQCR Take 5 mL every 12 hours as needed for cough 115 mL 0  . hydrOXYzine (ATARAX/VISTARIL) 10 MG tablet Take 1 tablet (10 mg total) by mouth 3  (three) times daily as needed. 90 tablet 2  . lamoTRIgine (LAMICTAL) 200 MG tablet Take 1 tablet (200 mg total) by mouth in the morning and at bedtime. 60 tablet 2   No current facility-administered medications for this visit.     Musculoskeletal: Strength & Muscle Tone: Unable to assess due to telehealth visit Gait & Station: Unable to assess due to telehealth visit Patient leans: N/A  Psychiatric Specialty Exam: Review of Systems  There were no vitals taken for this visit.There is no height or weight on file to calculate BMI.  General Appearance: Well Groomed  Eye Contact:  Good  Speech:  Clear and Coherent and Normal Rate  Volume:  Normal  Mood:  Euthymic and Notes that he is occassional anxious but reports that he is able to cope with his anxiety  Affect:  Congruent  Thought Process:  Coherent and Linear  Orientation:  Full (Time, Place, and Person)  Thought Content: Logical   Suicidal Thoughts:  No  Homicidal Thoughts:  No  Memory:  Immediate;   Good Recent;   Good Remote;   Good  Judgement:  Good  Insight:  Good  Psychomotor Activity:  Normal  Concentration:  Concentration: Good and Attention Span: Good  Recall:  Good  Fund of Knowledge: Good  Language: Good  Akathisia:  No  Handed:  Right  AIMS (if indicated): Not done  Assets:  Communication Skills Desire for Improvement Financial Resources/Insurance  ADL's:  Intact  Cognition: WNL  Sleep:  Fair    Assessment and Plan: Patient reports that he has been doing well on his current medication regimen. He notes he occassionally is anxious but is able to cope with it. No medication changes made today. Patient agreeable to continue all medications as prescribed.   1. Bipolar 2 disorder (HCC)  Continue- lamoTRIgine (LAMICTAL) 200 MG tablet; Take 1 tablet (200 mg total) by mouth in the morning and at bedtime.  Dispense: 60 tablet; Refill: 2  2. Generalized anxiety disorder  Continue-hydrOXYzine (ATARAX/VISTARIL)  10 MG tablet; Take 1 tablet (10 mg total) by mouth 3 (three) times daily as needed.  Dispense: 90 tablet; Refill: 2  Follow-up in 7months.   Shanna Cisco, NP 12/27/2020, 2:11 PM

## 2021-05-08 ENCOUNTER — Other Ambulatory Visit (HOSPITAL_COMMUNITY): Payer: Self-pay | Admitting: Psychiatry

## 2021-05-08 ENCOUNTER — Telehealth (HOSPITAL_COMMUNITY): Payer: Self-pay | Admitting: Psychiatry

## 2021-05-08 DIAGNOSIS — F411 Generalized anxiety disorder: Secondary | ICD-10-CM

## 2021-05-08 DIAGNOSIS — F3181 Bipolar II disorder: Secondary | ICD-10-CM

## 2021-05-08 MED ORDER — HYDROXYZINE HCL 10 MG PO TABS: 10.0000 mg | ORAL_TABLET | Freq: Three times a day (TID) | ORAL | 2 refills | Status: DC | PRN

## 2021-05-08 MED ORDER — LAMOTRIGINE 200 MG PO TABS: 200.0000 mg | ORAL_TABLET | Freq: Two times a day (BID) | ORAL | 2 refills | Status: DC

## 2021-05-08 NOTE — Telephone Encounter (Signed)
Medication refilled and sent to preferred pharmacy

## 2021-05-08 NOTE — Telephone Encounter (Signed)
Patient called requesting refill. Last seen 12/2020, scheduled for next available in 10/22.

## 2021-06-29 ENCOUNTER — Other Ambulatory Visit: Payer: Self-pay

## 2021-06-29 ENCOUNTER — Encounter (HOSPITAL_COMMUNITY): Payer: Self-pay | Admitting: Psychiatry

## 2021-06-29 ENCOUNTER — Telehealth (INDEPENDENT_AMBULATORY_CARE_PROVIDER_SITE_OTHER): Payer: BC Managed Care – PPO | Admitting: Psychiatry

## 2021-06-29 DIAGNOSIS — F411 Generalized anxiety disorder: Secondary | ICD-10-CM

## 2021-06-29 DIAGNOSIS — F3181 Bipolar II disorder: Secondary | ICD-10-CM

## 2021-06-29 MED ORDER — LAMOTRIGINE 200 MG PO TABS: 200.0000 mg | ORAL_TABLET | Freq: Two times a day (BID) | ORAL | 3 refills | Status: DC

## 2021-06-29 MED ORDER — HYDROXYZINE HCL 10 MG PO TABS: 10.0000 mg | ORAL_TABLET | Freq: Three times a day (TID) | ORAL | 3 refills | Status: DC | PRN

## 2021-06-29 NOTE — Progress Notes (Signed)
BH MD/PA/NP OP Progress Note Virtual Visit via Video Note  I connected with Mitchell Sanchez on 12/27/20 at  2:00 PM EDT by a video enabled telemedicine application and verified that I am speaking with the correct person using two identifiers.  Location: Patient: work Provider: Clinic   I discussed the limitations of evaluation and management by telemedicine and the availability of in person appointments. The patient expressed understanding and agreed to proceed.  I provided 30 minutes of non-face-to-face time during this encounter.    12/27/2020 2:11 PM Mitchell Sanchez  MRN:  [id number]  Chief Complaint: "  I am stressed in an outside of work"  HPI: 30 year old male seen today for follow-up evaluation and medication management.  He has a history of bipolar 2 disorder.  He is currently prescribed Lamictal 200 mg twice daily and hydroxyzine 10 mg three times daily as needed.  Patient reports that his medications are effective in managing his psychiatric conditions.  Today patient is pleasant, cooperative, and engaged in conversation.  He informed Clinical research associate that he continues to work at [employer] and finds work stressful.  He also notes that he has family stressors.  He informed Clinical research associate that his girlfriend is pregnant which excites him however notes that her girlfriend's grandfather is sick and his prognosis is not looking well.  He also informed Clinical research associate that his mother recently had a second stroke and he is concerned.  He informed Clinical research associate that despite the stressors he is able to cope and notes that he finds his medications effective.  Provider conducted a GAD-7 and he scored an 11, at his last visit he scored an 54.  Provider also conducted PHQ-9 he scored a 13, at his last visit he scored a 7.  He endorsed adequate sleep and appetite.  Today he denies SI/HI/VAH, mania, or paranoia.     No medication changes made today. Patient agreeable to continue all medications as prescribed. No other  concerns at this time.   Visit Diagnosis:    ICD-10-CM   1. Generalized anxiety disorder  F41.1 hydrOXYzine (ATARAX/VISTARIL) 10 MG tablet  2. Bipolar 2 disorder (HCC)  F31.81 lamoTRIgine (LAMICTAL) 200 MG tablet    Past Psychiatric History: Bipolar 2  Past Medical History: No past medical history on file. No past surgical history on file.  Family Psychiatric History: Patient reports that his mother has psychiatric conditions however he is unaware of which conditions  Family History: No family history on file.  Social History:  Social History   Socioeconomic History   Marital status: Single    Spouse name: Not on file   Number of children: Not on file   Years of education: Not on file   Highest education level: Not on file  Occupational History   Not on file  Tobacco Use   Smoking status: Current Every Day Smoker    Packs/day: 1.00    Types: Cigarettes   Smokeless tobacco: Never Used  Substance and Sexual Activity   Alcohol use: No   Drug use: Never   Sexual activity: Not on file  Other Topics Concern   Not on file  Social History Narrative   Not on file   Social Determinants of Health   Financial Resource Strain: Not on file  Food Insecurity: Not on file  Transportation Needs: Not on file  Physical Activity: Not on file  Stress: Not on file  Social Connections: Not on file    Allergies:  Allergies  Allergen Reactions  Cefzil [Cefprozil]     Metabolic Disorder Labs: No results found for: HGBA1C, MPG No results found for: PROLACTIN No results found for: CHOL, TRIG, HDL, CHOLHDL, VLDL, LDLCALC No results found for: TSH  Therapeutic Level Labs: No results found for: LITHIUM No results found for: VALPROATE No components found for:  CBMZ  Current Medications: Current Outpatient Medications  Medication Sig Dispense Refill   chlorpheniramine-HYDROcodone (TUSSIONEX PENNKINETIC ER) 10-8 MG/5ML LQCR Take 5 mL every 12 hours as needed for cough 115 mL 0    hydrOXYzine (ATARAX/VISTARIL) 10 MG tablet Take 1 tablet (10 mg total) by mouth 3 (three) times daily as needed. 90 tablet 2   lamoTRIgine (LAMICTAL) 200 MG tablet Take 1 tablet (200 mg total) by mouth in the morning and at bedtime. 60 tablet 2   No current facility-administered medications for this visit.     Musculoskeletal: Strength & Muscle Tone:  Unable to assess due to telehealth visit Gait & Station:  Unable to assess due to telehealth visit Patient leans: N/A  Psychiatric Specialty Exam: Review of Systems  There were no vitals taken for this visit.There is no height or weight on file to calculate BMI.  General Appearance: Well Groomed  Eye Contact:  Good  Speech:  Clear and Coherent and Normal Rate  Volume:  Normal  Mood:  Euthymic and Notes that he is occassional anxious but reports that he is able to cope with his anxiety  Affect:  Congruent  Thought Process:  Coherent and Linear  Orientation:  Full (Time, Place, and Person)  Thought Content: Logical   Suicidal Thoughts:  No  Homicidal Thoughts:  No  Memory:  Immediate;   Good Recent;   Good Remote;   Good  Judgement:  Good  Insight:  Good  Psychomotor Activity:  Normal  Concentration:  Concentration: Good and Attention Span: Good  Recall:  Good  Fund of Knowledge: Good  Language: Good  Akathisia:  No  Handed:  Right  AIMS (if indicated): Not done  Assets:  Communication Skills Desire for Improvement Financial Resources/Insurance  ADL's:  Intact  Cognition: WNL  Sleep:  Good    Assessment and Plan: Patient reports that he has been doing well on his current medication regimen. He notes he occassionally is anxious and depressed but is able to cope with it. No medication changes made today. Patient agreeable to continue all medications as prescribed.   1. Bipolar 2 disorder (HCC)  Continue- lamoTRIgine (LAMICTAL) 200 MG tablet; Take 1 tablet (200 mg total) by mouth in the morning and at bedtime.  Dispense: 60  tablet; Refill: 3  2. Generalized anxiety disorder  Continue-hydrOXYzine (ATARAX/VISTARIL) 10 MG tablet; Take 1 tablet (10 mg total) by mouth 3 (three) times daily as needed.  Dispense: 90 tablet; Refill: 3  Follow-up in 46months.   Shanna Cisco, NP 12/27/2020, 2:11 PM

## 2021-09-27 ENCOUNTER — Encounter (HOSPITAL_COMMUNITY): Payer: Self-pay | Admitting: Psychiatry

## 2021-09-27 ENCOUNTER — Telehealth (INDEPENDENT_AMBULATORY_CARE_PROVIDER_SITE_OTHER): Payer: BC Managed Care – PPO | Admitting: Psychiatry

## 2021-09-27 DIAGNOSIS — F411 Generalized anxiety disorder: Secondary | ICD-10-CM

## 2021-09-27 DIAGNOSIS — F3181 Bipolar II disorder: Secondary | ICD-10-CM

## 2021-09-27 MED ORDER — HYDROXYZINE HCL 10 MG PO TABS: 10.0000 mg | ORAL_TABLET | Freq: Three times a day (TID) | ORAL | 3 refills | Status: AC | PRN

## 2021-09-27 MED ORDER — LAMOTRIGINE 200 MG PO TABS: 200.0000 mg | ORAL_TABLET | Freq: Two times a day (BID) | ORAL | 3 refills | Status: AC

## 2021-09-27 NOTE — Progress Notes (Signed)
[place] MD/PA/NP OP Progress Note Virtual Visit via Telephone Note  I connected with Mitchell Sanchez on 09/27/21 at  4:00 PM EST by telephone and verified that I am speaking with the correct person using two identifiers.  Location: Patient: home Provider: Clinic   I discussed the limitations, risks, security and privacy concerns of performing an evaluation and management service by telephone and the availability of in person appointments. I also discussed with the patient that there may be a patient responsible charge related to this service. The patient expressed understanding and agreed to proceed.   I provided 30 minutes of non-face-to-face time during this encounter.     09/27/2021 12:16 PM Mitchell Sanchez  MRN:  [id number]  Chief Complaint: "  I am doing okay"  HPI: 31 year old male seen today for follow-up evaluation and medication management.  He has a history of bipolar 2 disorder.  He is currently prescribed Lamictal 200 mg twice daily and hydroxyzine 10 mg three times daily as needed.  Patient reports that his medications are effective in managing his psychiatric conditions.  Today patient was unable to logon virtually so assessment was done on phone.  During exam he was pleasant, cooperative, engaged in conversation.  He informed Probation officer that at times he is stressed at work and notes that this is becoming tiresome.  Patient continues to work at [employer].  He does note that outside of work things are going well.  He informed Probation officer that he and his girlfriend are expecting expecting a daughter (Winter) in March.  Patient endorses adequate sleep and appetite.  Today he denies SI/HI/VH, mania, or paranoia.  Patient notes at this time he cannot do a GAD-7 or PHQ-9 as he is expecting another phone call.  Provider informed patient that these assessments can be done at his next visit.    No medication changes made today. Patient agreeable to continue all medications as prescribed. No  other concerns at this time.   Visit Diagnosis:    ICD-10-CM   1. Generalized anxiety disorder  F41.1 hydrOXYzine (ATARAX) 10 MG tablet    2. Bipolar 2 disorder (HCC)  F31.81 lamoTRIgine (LAMICTAL) 200 MG tablet      Past Psychiatric History: Bipolar 2  Past Medical History: History reviewed. No pertinent past medical history. History reviewed. No pertinent surgical history.  Family Psychiatric History: Patient reports that his mother has psychiatric conditions however he is unaware of which conditions  Family History: History reviewed. No pertinent family history.  Social History:  Social History   Socioeconomic History   Marital status: Single    Spouse name: Not on file   Number of children: Not on file   Years of education: Not on file   Highest education level: Not on file  Occupational History   Not on file  Tobacco Use   Smoking status: Every Day    Packs/day: 1.00    Types: Cigarettes   Smokeless tobacco: Never  Substance and Sexual Activity   Alcohol use: No   Drug use: Never   Sexual activity: Not on file  Other Topics Concern   Not on file  Social History Narrative   Not on file   Social Determinants of Health   Financial Resource Strain: Not on file  Food Insecurity: Not on file  Transportation Needs: Not on file  Physical Activity: Not on file  Stress: Not on file  Social Connections: Not on file    Allergies:  Allergies  Allergen  Reactions   Cefzil [Cefprozil]     Metabolic Disorder Labs: No results found for: HGBA1C, MPG No results found for: PROLACTIN No results found for: CHOL, TRIG, HDL, CHOLHDL, VLDL, LDLCALC No results found for: TSH  Therapeutic Level Labs: No results found for: LITHIUM No results found for: VALPROATE No components found for:  CBMZ  Current Medications: Current Outpatient Medications  Medication Sig Dispense Refill   chlorpheniramine-HYDROcodone (TUSSIONEX PENNKINETIC ER) 10-8 MG/5ML LQCR Take 5 mL every 12  hours as needed for cough 115 mL 0   hydrOXYzine (ATARAX) 10 MG tablet Take 1 tablet (10 mg total) by mouth 3 (three) times daily as needed. 90 tablet 3   lamoTRIgine (LAMICTAL) 200 MG tablet Take 1 tablet (200 mg total) by mouth in the morning and at bedtime. 60 tablet 3   No current facility-administered medications for this visit.     Musculoskeletal: Strength & Muscle Tone:  Unable to assess due to telephone visit Gait & Station:  Unable to assess due to telephone visit Patient leans: N/A  Psychiatric Specialty Exam: Review of Systems  There were no vitals taken for this visit.There is no height or weight on file to calculate BMI.  General Appearance:  Unable to assess due to telephone visi  Eye Contact:   Unable to assess due to telephone visi  Speech:  Clear and Coherent and Normal Rate  Volume:  Normal  Mood:  Euthymic  Affect:  Congruent  Thought Process:  Coherent and Linear  Orientation:  Full (Time, Place, and Person)  Thought Content: Logical   Suicidal Thoughts:  No  Homicidal Thoughts:  No  Memory:  Immediate;   Good Recent;   Good Remote;   Good  Judgement:  Good  Insight:  Good  Psychomotor Activity:   Unable to assess due to telephone visi  Concentration:  Concentration: Good and Attention Span: Good  Recall:  Good  Fund of Knowledge: Good  Language: Good  Akathisia:  No  Handed:  Right  AIMS (if indicated): Not done  Assets:  Communication Skills Desire for Improvement Financial Resources/Insurance  ADL's:  Intact  Cognition: WNL  Sleep:  Good    Assessment and Plan: Patient reports that he has been doing well on his current medication regimen. No medication changes made today. Patient agreeable to continue all medications as prescribed.   1. Bipolar 2 disorder (HCC)  Continue- lamoTRIgine (LAMICTAL) 200 MG tablet; Take 1 tablet (200 mg total) by mouth in the morning and at bedtime.  Dispense: 60 tablet; Refill: 3  2. Generalized anxiety  disorder  Continue-hydrOXYzine (ATARAX/VISTARIL) 10 MG tablet; Take 1 tablet (10 mg total) by mouth 3 (three) times daily as needed.  Dispense: 90 tablet; Refill: 3  Follow-up in 26months.   Salley Slaughter, NP 09/27/2021, 12:16 PM

## 2021-12-12 ENCOUNTER — Encounter (HOSPITAL_COMMUNITY): Payer: Self-pay

## 2021-12-12 ENCOUNTER — Telehealth (HOSPITAL_COMMUNITY): Payer: BC Managed Care – PPO | Admitting: Psychiatry

## 2022-02-15 DIAGNOSIS — F3132 Bipolar disorder, current episode depressed, moderate: Secondary | ICD-10-CM | POA: Diagnosis not present

## 2022-03-15 DIAGNOSIS — F3132 Bipolar disorder, current episode depressed, moderate: Secondary | ICD-10-CM | POA: Diagnosis not present

## 2022-04-05 DIAGNOSIS — F3132 Bipolar disorder, current episode depressed, moderate: Secondary | ICD-10-CM | POA: Diagnosis not present

## 2022-04-11 ENCOUNTER — Other Ambulatory Visit (HOSPITAL_COMMUNITY): Payer: Self-pay | Admitting: Psychiatry

## 2022-04-11 DIAGNOSIS — F3181 Bipolar II disorder: Secondary | ICD-10-CM

## 2022-04-26 DIAGNOSIS — F3132 Bipolar disorder, current episode depressed, moderate: Secondary | ICD-10-CM | POA: Diagnosis not present

## 2022-05-17 DIAGNOSIS — F3132 Bipolar disorder, current episode depressed, moderate: Secondary | ICD-10-CM | POA: Diagnosis not present

## 2022-06-06 DIAGNOSIS — F3132 Bipolar disorder, current episode depressed, moderate: Secondary | ICD-10-CM | POA: Diagnosis not present
